# Patient Record
Sex: Female | Born: 1985 | Race: Black or African American | Hispanic: No | Marital: Married | Smoking: Never smoker
Health system: Southern US, Community
[De-identification: ages and names within clinical notes are randomized; demographics above are authoritative.]

---

## 2010-02-19 ENCOUNTER — Emergency Department (HOSPITAL_COMMUNITY): Admission: EM | Admit: 2010-02-19 | Discharge: 2010-02-19 | Payer: Self-pay | Admitting: Family Medicine

## 2014-08-18 ENCOUNTER — Encounter (HOSPITAL_COMMUNITY): Payer: Self-pay | Admitting: Emergency Medicine

## 2014-08-18 ENCOUNTER — Emergency Department (HOSPITAL_COMMUNITY)
Admission: EM | Admit: 2014-08-18 | Discharge: 2014-08-18 | Disposition: A | Discharge status: Home / Self Care | Payer: Self-pay | Attending: Emergency Medicine | Admitting: Emergency Medicine

## 2014-08-18 DIAGNOSIS — B9689 Other specified bacterial agents as the cause of diseases classified elsewhere: Secondary | ICD-10-CM | POA: Insufficient documentation

## 2014-08-18 DIAGNOSIS — N342 Other urethritis: Secondary | ICD-10-CM | POA: Insufficient documentation

## 2014-08-18 LAB — POC URINE PREG, ED: Preg Test, Ur: NEGATIVE

## 2014-08-18 LAB — URINALYSIS, ROUTINE W REFLEX MICROSCOPIC
BILIRUBIN URINE: NEGATIVE
Glucose, UA: NEGATIVE mg/dL
HGB URINE DIPSTICK: NEGATIVE
KETONES UR: NEGATIVE mg/dL
NITRITE: NEGATIVE
Protein, ur: NEGATIVE mg/dL
SPECIFIC GRAVITY, URINE: 1.02 (ref 1.005–1.030)
Urobilinogen, UA: 0.2 mg/dL (ref 0.0–1.0)
pH: 6.5 (ref 5.0–8.0)

## 2014-08-18 LAB — URINE MICROSCOPIC-ADD ON

## 2014-08-18 NOTE — ED Notes (Addendum)
Pt phone [phone]: verified correct in pt chart

## 2014-08-18 NOTE — Discharge Instructions (Signed)
Your urine analysis showed no signs if infection. We will culture your urine, and if it returns positive you will receive a call. Follow up with your doctor or your ob/gyn. Return if worsening.    Dysuria Dysuria is the medical term for pain with urination. There are many causes for dysuria, but urinary tract infection is the most common. If a urinalysis was performed it can show that there is a urinary tract infection. A urine culture confirms that you or your child is sick. You will need to follow up with a healthcare provider because:  If a urine culture was done you will need to know the culture results and treatment recommendations.  If the urine culture was positive, you or your child will need to be put on antibiotics or know if the antibiotics prescribed are the right antibiotics for your urinary tract infection.  If the urine culture is negative (no urinary tract infection), then other causes may need to be explored or antibiotics need to be stopped. Today laboratory work may have been done and there does not seem to be an infection. If cultures were done they will take at least 24 to 48 hours to be completed. Today x-rays may have been taken and they read as normal. No cause can be found for the problems. The x-rays may be re-read by a radiologist and you will be contacted if additional findings are made. You or your child may have been put on medications to help with this problem until you can see your primary caregiver. If the problems get better, see your primary caregiver if the problems return. If you were given antibiotics (medications which kill germs), take all of the mediations as directed for the full course of treatment.  If laboratory work was done, you need to find the results. Leave a telephone number where you can be reached. If this is not possible, make sure you find out how you are to get test results. HOME CARE INSTRUCTIONS   Drink lots of fluids. For adults, drink  eight, 8 ounce glasses of clear juice or water a day. For children, replace fluids as suggested by your caregiver.  Empty the bladder often. Avoid holding urine for long periods of time.  After a bowel movement, women should cleanse front to back, using each tissue only once.  Empty your bladder before and after sexual intercourse.  Take all the medicine given to you until it is gone. You may feel better in a few days, but TAKE ALL MEDICINE.  Avoid caffeine, tea, alcohol and carbonated beverages, because they tend to irritate the bladder.  In men, alcohol may irritate the prostate.  Only take over-the-counter or prescription medicines for pain, discomfort, or fever as directed by your caregiver.  If your caregiver has given you a follow-up appointment, it is very important to keep that appointment. Not keeping the appointment could result in a chronic or permanent injury, pain, and disability. If there is any problem keeping the appointment, you must call back to this facility for assistance. SEEK IMMEDIATE MEDICAL CARE IF:   Back pain develops.  A fever develops.  There is nausea (feeling sick to your stomach) or vomiting (throwing up).  Problems are no better with medications or are getting worse. MAKE SURE YOU:   Understand these instructions.  Will watch your condition.  Will get help right away if you are not doing well or get worse. Document Released: 07/19/2004 Document Revised: 01/13/2012 Document Reviewed: 05/26/2008 ExitCare Patient  Information 2015 [place], [place]. This information is not intended to replace advice given to you by your health care provider. Make sure you discuss any questions you have with your health care provider.

## 2014-08-18 NOTE — ED Provider Notes (Signed)
Pt has dysuria X 4 days - "tinging", in the vagina with urination - no d/c, no bleeding, no systemic sx.  Soft NT abd on my exam.  Labs pending.  UA neg, pt refuses pelvic exam, soft and NT abd  Filed Vitals:   08/18/14 0901 08/18/14 0919 08/18/14 1000 08/18/14 1026  BP: 149/74  130/63   Pulse: 102  84   Temp: 98.7 F (37.1 C)   98.7 F (37.1 C)  TempSrc: Oral   Oral  Resp:  14    Height: 5\' 7"  (1.702 m)     Weight: 210 lb (95.255 kg)     SpO2: 100%  100%      Medical screening examination/treatment/procedure(s) were conducted as a shared visit with non-physician practitioner(s) and myself.  I personally evaluated the patient during the encounter.  Clinical Impression:   Final diagnoses:  Urethritis         Vida RollerBrian D Salbador Fiveash, MD 08/18/14 2032

## 2014-08-18 NOTE — ED Notes (Signed)
Pt states she has had tingling and pressure when urinating.  Pt denies pain while not urinating.

## 2014-08-18 NOTE — ED Provider Notes (Signed)
CSN: [id number]     Arrival date & time 08/18/14  0848 History   First MD Initiated Contact with Patient 08/18/14 [phone]     Chief Complaint  Patient presents with  . Urinary Tract Infection     (Consider location/radiation/quality/duration/timing/severity/associated sxs/prior Treatment) HPI Olivia Murray is a 28 y.o. female who presents to ED with complaint of "tingling when urinating and pressure." Pt states symptoms began about 3 days ago. Denies abd pain, back pain, urinary frequency or urgency. No vaginal discharge or bleeding. No concern for STI. No rash. States tried to drink cranberry juice with no relief. Hx of UTI 11 years ago when pregnant. No fever, chill, nausea, vomiting.   History reviewed. No pertinent past medical history. History reviewed. No pertinent past surgical history. No family history on file. History  Substance Use Topics  . Smoking status: Not on file  . Smokeless tobacco: Not on file  . Alcohol Use: Not on file   OB History   Grav Para Term Preterm Abortions TAB SAB Ect Mult Living                 Review of Systems  Constitutional: Negative for fever and chills.  Respiratory: Negative for cough, chest tightness and shortness of breath.   Cardiovascular: Negative for chest pain, palpitations and leg swelling.  Gastrointestinal: Negative for nausea, vomiting and abdominal pain.  Genitourinary: Positive for dysuria. Negative for urgency, frequency, flank pain, vaginal bleeding, vaginal discharge, difficulty urinating, vaginal pain and pelvic pain.  Musculoskeletal: Negative for arthralgias, myalgias, neck pain and neck stiffness.  Skin: Negative for rash.  Neurological: Negative for dizziness, weakness and headaches.  All other systems reviewed and are negative.     Allergies  Review of patient's allergies indicates not on file.  Home Medications   Prior to Admission medications   Not on File   BP 149/74  Pulse 102  Temp(Src) 98.7 F  (37.1 C) (Oral)  Ht 5\' 7"  (1.702 m)  Wt 210 lb (95.255 kg)  BMI 32.88 kg/m2  SpO2 100% Physical Exam  Nursing note and vitals reviewed. Constitutional: She appears well-developed and well-nourished. No distress.  HENT:  Head: Normocephalic.  Eyes: Conjunctivae are normal.  Neck: Neck supple.  Cardiovascular: Normal rate, regular rhythm and normal heart sounds.   Pulmonary/Chest: Effort normal and breath sounds normal. No respiratory distress. She has no wheezes. She has no rales.  Abdominal: Soft. Bowel sounds are normal. She exhibits no distension. There is no tenderness. There is no rebound.  No CVA tenderness bilaterally  Musculoskeletal: She exhibits no edema.  Neurological: She is alert.  Skin: Skin is warm and dry.  Psychiatric: She has a normal mood and affect. Her behavior is normal.    ED Course  Procedures (including critical care time) Labs Review Labs Reviewed  URINALYSIS, ROUTINE W REFLEX MICROSCOPIC - Abnormal; Notable for the following:    Leukocytes, UA TRACE (*)    All other components within normal limits  URINE CULTURE  URINE MICROSCOPIC-ADD ON  POC URINE PREG, ED    Imaging Review No results found. her he   EKG Interpretation None      MDM   Final diagnoses:  Urethritis    Pt with dysuria, pressure when urinating. Denies vaginal complaints, does not want pelvic exam. UA obtained, no signs of infection. Culture sent. Urine preg negative. Pt reassured. Possible non infectious urethritis. instructed to follow up with pcp. Pt agrees to the plan.   Filed Vitals:  08/18/14 0901 08/18/14 0919  BP: 149/74   Pulse: 102   Temp: 98.7 F (37.1 C)   TempSrc: Oral   Resp:  14  Height: 5\' 7"  (1.702 m)   Weight: 210 lb (95.255 kg)   SpO2: 100%       Quinlee Sciarra A Nichlos Kunzler, PA-C 08/18/14 1505

## 2014-08-18 NOTE — ED Provider Notes (Signed)
Medical screening examination/treatment/procedure(s) were performed by non-physician practitioner and as supervising physician I was immediately available for consultation/collaboration.    Vida RollerBrian D Lynnwood Beckford, MD 08/18/14 2033

## 2014-08-20 LAB — URINE CULTURE

## 2014-08-22 ENCOUNTER — Telehealth (HOSPITAL_BASED_OUTPATIENT_CLINIC_OR_DEPARTMENT_OTHER): Payer: Self-pay

## 2014-08-22 NOTE — Telephone Encounter (Signed)
Post ED Visit - Positive Culture Follow-up  Culture report reviewed by antimicrobial stewardship pharmacist: []  Wes Dulaney, Pharm.D., BCPS [x]  Celedonio MiyamotoJeremy Frens, Pharm.D., BCPS/Emily PatrickWest PA []  Georgina PillionElizabeth Martin, [place]arm.D., BCPS []  KenwoodMinh Pham, [address]Pharm[address]D., BCPS, AAHIVP []  Estella HuskMichelle Turner, Pharm.D., BCPS, AAHIVP []  Carly Sabat, Pharm.D. []  Enzo BiNathan Batchelder, [address]Pharm.D.  Positive Urine culture No further patient follow-up is required at this time.  Arvid RightClark, Raymar Joiner Dorn 08/22/2014, 4:32 AM

## 2014-09-05 ENCOUNTER — Encounter (HOSPITAL_COMMUNITY): Payer: Self-pay | Admitting: Emergency Medicine

## 2015-08-21 ENCOUNTER — Emergency Department (HOSPITAL_COMMUNITY)
Admission: EM | Admit: 2015-08-21 | Discharge: 2015-08-21 | Disposition: A | Discharge status: Home / Self Care | Payer: Self-pay | Attending: Emergency Medicine | Admitting: Emergency Medicine

## 2015-08-21 ENCOUNTER — Encounter (HOSPITAL_COMMUNITY): Payer: Self-pay | Admitting: *Deleted

## 2015-08-21 DIAGNOSIS — Y9389 Activity, other specified: Secondary | ICD-10-CM | POA: Insufficient documentation

## 2015-08-21 DIAGNOSIS — Y9289 Other specified places as the place of occurrence of the external cause: Secondary | ICD-10-CM | POA: Insufficient documentation

## 2015-08-21 DIAGNOSIS — X58XXXA Exposure to other specified factors, initial encounter: Secondary | ICD-10-CM | POA: Insufficient documentation

## 2015-08-21 DIAGNOSIS — Y998 Other external cause status: Secondary | ICD-10-CM | POA: Insufficient documentation

## 2015-08-21 DIAGNOSIS — S0501XA Injury of conjunctiva and corneal abrasion without foreign body, right eye, initial encounter: Secondary | ICD-10-CM

## 2015-08-21 MED ORDER — FLUORESCEIN SODIUM 1 MG OP STRP
1.0000 | ORAL_STRIP | Freq: Once | OPHTHALMIC | Status: AC
  Administered 2015-08-21: 1 via OPHTHALMIC
  Filled 2015-08-21: qty 1

## 2015-08-21 MED ORDER — ERYTHROMYCIN 5 MG/GM OP OINT: TOPICAL_OINTMENT | OPHTHALMIC | Status: AC

## 2015-08-21 MED ORDER — TETRACAINE HCL 0.5 % OP SOLN
2.0000 [drp] | Freq: Once | OPHTHALMIC | Status: AC
  Administered 2015-08-21: 2 [drp] via OPHTHALMIC
  Filled 2015-08-21: qty 2

## 2015-08-21 NOTE — ED Provider Notes (Signed)
CSN: [id number]     Arrival date & time 08/21/15  1635 History   By signing my name below, I, Olivia Murray, attest that this documentation has been prepared under the direction and in the presence of Ayinde Swim PA-C.  Electronically Signed: Arlan OrganAshley Murray, ED Scribe. 08/21/2015. 6:45 PM.   Chief Complaint  Patient presents with  . Eye Problem   The history is provided by the patient. No language interpreter was used.    HPI Comments: Olivia Murray is a 29 y.o. female without any pertinent past medical history who presents to the Emergency Department here for an eye problem this evening. Pt states something was flew in her R eye at approximately 3:15 PM yesterday and she attempted to remove the object with large amount of irrigation. States she then noted redness, irritation, clear drainage, and mild itching to the eye that has not improved. OTC clear eyes and sons prescribed eye ointment attempted to eye without any relief. Denies any recent fever or chills. No current visual changes. Mrs. Olivia Murray is is followed by ophthalmologist. No known allergies to medications.  PCP: No PCP Per Patient   History reviewed. No pertinent past medical history. History reviewed. No pertinent past surgical history. No family history on file. Social History  Substance Use Topics  . Smoking status: Never Smoker   . Smokeless tobacco: None  . Alcohol Use: 1.2 oz/week    2 Glasses of wine per week   OB History    Gravida Para Term Preterm AB TAB SAB Ectopic Multiple Living   4         4     Review of Systems  Constitutional: Negative for fever and chills.  Eyes: Positive for pain, discharge, redness and itching. Negative for photophobia and visual disturbance.  Psychiatric/Behavioral: Negative for confusion.  All other systems reviewed and are negative.     Allergies  Review of patient's allergies indicates no known allergies.  Home Medications   Prior to Admission medications    Medication Sig Start Date End Date Taking? Authorizing Provider  erythromycin ophthalmic ointment Place a 1/2 inch ribbon of ointment into the lower eyelid twice a day for 10 days 08/21/15   Marlon Peliffany Jim Philemon, PA-C   Triage Vitals: BP 125/71 mmHg  Pulse 75  Temp(Src) 99 F (37.2 C) (Temporal)  Resp 18  Wt 215 lb (97.523 kg)  SpO2 100%  LMP 08/17/2015   Physical Exam  Constitutional: She is oriented to person, place, and time. She appears well-developed and well-nourished.  HENT:  Head: Normocephalic.  Eyes: EOM and lids are normal. Pupils are equal, round, and reactive to light. Right eye exhibits no discharge and no exudate. Left eye exhibits no discharge and no exudate. Right conjunctiva is injected. Right conjunctiva has no hemorrhage. Left conjunctiva is not injected. Left conjunctiva has no hemorrhage. Right eye exhibits normal extraocular motion and no nystagmus. Left eye exhibits normal extraocular motion and no nystagmus.    Visual Acuity - Bilateral Distance: 20/15 ; R Distance: 20/20 ; L Distance: 20/20  Neck: Normal range of motion.  Pulmonary/Chest: Effort normal.  Abdominal: She exhibits no distension.  Musculoskeletal: Normal range of motion.  Neurological: She is alert and oriented to person, place, and time.  Psychiatric: She has a normal mood and affect.  Nursing note and vitals reviewed.   ED Course  Procedures (including critical care time)  DIAGNOSTIC STUDIES: Oxygen Saturation is 100% on RA, Normal by my interpretation.    COORDINATION OF  CARE: 6:42 PM-Will discharge with a prescription for an antibiotic ointment to apply BID. 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 Discussed treatment plan with pt at bedside and pt agreed to plan.     Labs Review Labs Reviewed - No data to display  Imaging Review No results found. I have personally reviewed and evaluated these images and lab results  as part of my medical decision-making.   EKG Interpretation None      MDM   Final diagnoses:  Corneal abrasion, right, initial encounter    Rx: erythromycin opht  Medications  tetracaine (PONTOCAINE) 0.5 % ophthalmic solution 2 drop (2 drops Right Eye Given 08/21/15 1756)  fluorescein ophthalmic strip 1 strip (1 strip Right Eye Given 08/21/15 1756)    29 y.o.Olivia Murray's medical screening exam was performed and I feel the patient has had an appropriate workup for their chief complaint at this time and likelihood of emergent condition existing is low. They have been counseled on decision, discharge, follow up and which symptoms necessitate immediate return to the emergency department. They or their family verbally stated understanding and agreement with plan and discharged in stable condition.   Vital signs are stable at discharge. Filed Vitals:   08/21/15 1908  BP: 125/71  Pulse: 75  Temp:   Resp: 18    I personally performed the services described in this documentation, which was scribed in my presence. The recorded information has been reviewed and is accurate.   Marlon Pel, PA-C 08/21/15 1910  Jerelyn Scott, MD 08/21/15 [phone]

## 2015-08-21 NOTE — ED Notes (Signed)
Pt reports R eye irritation and redness.  States she scratched it yesterday, now feels like something is in it.  Redness noted.  Pt reports clear drainage.  Denies any visual changes at this time.

## 2015-08-21 NOTE — Discharge Instructions (Signed)

## 2016-12-21 ENCOUNTER — Emergency Department (HOSPITAL_COMMUNITY)
Admission: EM | Admit: 2016-12-21 | Discharge: 2016-12-21 | Disposition: A | Discharge status: Home / Self Care | Payer: Self-pay | Attending: Emergency Medicine | Admitting: Emergency Medicine

## 2016-12-21 ENCOUNTER — Encounter (HOSPITAL_COMMUNITY): Payer: Self-pay | Admitting: Emergency Medicine

## 2016-12-21 DIAGNOSIS — Z79899 Other long term (current) drug therapy: Secondary | ICD-10-CM | POA: Insufficient documentation

## 2016-12-21 DIAGNOSIS — J111 Influenza due to unidentified influenza virus with other respiratory manifestations: Secondary | ICD-10-CM | POA: Insufficient documentation

## 2016-12-21 DIAGNOSIS — R69 Illness, unspecified: Secondary | ICD-10-CM

## 2016-12-21 MED ORDER — IBUPROFEN 800 MG PO TABS: 800.0000 mg | ORAL_TABLET | Freq: Three times a day (TID) | ORAL | 0 refills | Status: AC | PRN

## 2016-12-21 NOTE — ED Provider Notes (Signed)
[facility]-EMERGENCY DEPT Provider Note   CSN: [id number] Arrival date & time: 12/21/16  1231   By signing my name below, I, Soijett Blue, attest that this documentation has been prepared under the direction and in the presence of Trixie DredgeEmily Dlynn Ranes, PA-C Electronically Signed: Soijett Blue, ED Scribe. 12/21/16. 1:26 PM.  History   Chief Complaint Chief Complaint  Patient presents with  . Flu Like Symptoms    HPI Olivia Murray is a 31 y.o. female who presents to the Emergency Department complaining of flu-like symptoms onset 2 days ago. Pt reports associated chills, fever of 99.9 last night, sore throat, cough, rhinorrhea, and body aches. Pt has tried motrin with her last dose being yesterday with no relief of her symptoms. Pt notes that she didn't obtain her flu vaccination this past year. Pt notes that the her 33 year old son tested positive for the flu yesterday. Pt denies abdominal pain, nausea, vomiting, SOB, rash, urinary symptoms, ear pain, and any other symptoms. Denies hx of asthma.   The history is provided by the patient. No language interpreter was used.    History reviewed. No pertinent past medical history.  There are no active problems to display for this patient.   History reviewed. No pertinent surgical history.  OB History    Gravida Para Term Preterm AB Living   4         4   SAB TAB Ectopic Multiple Live Births                   Home Medications    Prior to Admission medications   Medication Sig Start Date End Date Taking? Authorizing Provider  erythromycin ophthalmic ointment Place a 1/2 inch ribbon of ointment into the lower eyelid twice a day for 10 days 08/21/15   Marlon Peliffany Greene, PA-C  ibuprofen (ADVIL,MOTRIN) 800 MG tablet Take 1 tablet (800 mg total) by mouth every 8 (eight) hours as needed for mild pain or moderate pain. 12/21/16   Trixie DredgeEmily Ardel Jagger, PA-C    Family History No family history on file.  Social History Social History  Substance Use Topics  .  Smoking status: Never Smoker  . Smokeless tobacco: Not on file  . Alcohol use 1.2 oz/week    2 Glasses of wine per week     Allergies   Patient has no known allergies.   Review of Systems Review of Systems  Constitutional: Positive for chills and fever.  HENT: Positive for rhinorrhea and sore throat. Negative for ear pain.   Respiratory: Positive for cough. Negative for shortness of breath.   Gastrointestinal: Negative for abdominal pain, nausea and vomiting.  Genitourinary: Negative for difficulty urinating.  Musculoskeletal: Positive for myalgias.     Physical Exam Updated Vital Signs BP 156/96 (BP Location: Right Arm)   Pulse 96   Temp 99.4 F (37.4 C) (Oral)   Resp 16   Wt 101.6 kg   LMP 11/30/2016   SpO2 97%   BMI 35.07 kg/m   Physical Exam  Constitutional: She appears well-developed and well-nourished. No distress.  HENT:  Head: Normocephalic and atraumatic.  Nose: Mucosal edema present.  Mouth/Throat: Oropharynx is clear and moist. No oropharyngeal exudate.  Edema and erythema of the nasal mucosa.   Eyes: Conjunctivae are normal.  Neck: Neck supple.  Cardiovascular: Normal rate, regular rhythm and normal heart sounds.  Exam reveals no gallop and no friction rub.   No murmur heard. Pulmonary/Chest: Effort normal and breath sounds normal. No respiratory distress.  She has no wheezes. She has no rales.  Neurological: She is alert.  Skin: She is not diaphoretic.  Nursing note and vitals reviewed.    ED Treatments / Results  DIAGNOSTIC STUDIES: Oxygen Saturation is 97% on RA, nl by my interpretation.    COORDINATION OF CARE: 1:18 PM Discussed treatment plan with pt at bedside which includes ibuprofen Rx and pt agreed to plan.  Procedures Procedures (including critical care time)  Medications Ordered in ED Medications - No data to display   Initial Impression / Assessment and Plan / ED Course  I have reviewed the triage vital signs and the nursing  notes.   Patient with symptoms consistent with flu-like illness. Pt with slightly elevated temperature of 99.4 Vitals are stable. No signs of dehydration, tolerating PO's. Lungs are clear. Due to patient's presentation and physical exam a chest x-ray was not ordered bc likely diagnosis of flu.  Discussed the cost versus benefit of Tamiflu treatment with the patient. Patient will be discharged with instructions to orally hydrate, rest, and use over-the-counter medications such as anti-inflammatories ibuprofen and Aleve for muscle aches and Tylenol for fever.  Patient will also be given a cough suppressant. Read the information below.  You may return to the Emergency Department at any time for worsening condition or any new symptoms that concern you.   Final Clinical Impressions(s) / ED Diagnoses   Final diagnoses:  Influenza-like illness    New Prescriptions Discharge Medication List as of 12/21/2016  1:23 PM    START taking these medications   Details  ibuprofen (ADVIL,MOTRIN) 800 MG tablet Take 1 tablet (800 mg total) by mouth every 8 (eight) hours as needed for mild pain or moderate pain., Starting Sat 12/21/2016, Print       I personally performed the services described in this documentation, which was scribed in my presence. The recorded information has been reviewed and is accurate.    Trixie Dredge, PA-C 12/21/16 1441    Gwyneth Sprout, MD 12/21/16 [phone]

## 2016-12-21 NOTE — ED Triage Notes (Addendum)
Pt complaint of flu like symptoms with associated chills/fever, sore throat, and cough onset Thursday. Last motrin taken 2200 yesterday. Denies n/v/d.

## 2016-12-21 NOTE — Discharge Instructions (Signed)
Read the information below.  Use the prescribed medication as directed.  Please discuss all new medications with your pharmacist.  You may return to the Emergency Department at any time for worsening condition or any new symptoms that concern you.  If you develop high fevers that do not resolve with tylenol or ibuprofen, you have difficulty swallowing or breathing, or you are unable to tolerate fluids by mouth, return to the ER for a recheck.    °

## 2020-05-15 ENCOUNTER — Ambulatory Visit: Payer: Self-pay | Attending: Internal Medicine

## 2020-05-15 DIAGNOSIS — Z23 Encounter for immunization: Secondary | ICD-10-CM

## 2020-05-15 NOTE — Progress Notes (Signed)
° °  Covid-19 Vaccination Clinic  Name:  Corayma Cashatt    MRN: [id number] DOB: [birth year]  05/15/2020  Ms. Gillentine was observed post Covid-19 immunization for 15 minutes without incident. She was provided with Vaccine Information Sheet and instruction to access the V-Safe system.   Ms. Branan was instructed to call 911 with any severe reactions post vaccine:  Difficulty breathing   Swelling of face and throat   A fast heartbeat   A bad rash all over body   Dizziness and weakness   Immunizations Administered    Name Date Dose VIS Date Route   Pfizer COVID-19 Vaccine 05/15/2020 10:53 AM 0.3 mL 12/29/2018 Intramuscular   Manufacturer: [employer], [employer]   Lot: [id number]   NDC: [address]-6

## 2020-06-06 ENCOUNTER — Ambulatory Visit: Payer: Self-pay | Attending: Internal Medicine

## 2020-06-06 DIAGNOSIS — Z23 Encounter for immunization: Secondary | ICD-10-CM

## 2020-06-06 NOTE — Progress Notes (Signed)
   Covid-19 Vaccination Clinic  Name:  Olivia Murray    MRN: [id number] DOB: 10-23-1986  06/06/2020  [name] was observed post Covid-19 immunization for 15 minutes without incident. She was provided with Vaccine Information Sheet and instruction to access the V-Safe system.   [name] was instructed to call 911 with any severe reactions post vaccine: Marland Kitchen Difficulty breathing  . Swelling of face and throat  . A fast heartbeat  . A bad rash all over body  . Dizziness and weakness   Immunizations Administered    Name Date Dose VIS Date Route   Pfizer COVID-19 Vaccine 06/06/2020  9:12 AM 0.3 mL 12/29/2018 Intramuscular   Manufacturer: [employer], [employer]   Lot: [id number]   NDC: [address]-9

## 2020-08-22 ENCOUNTER — Ambulatory Visit
Admission: RE | Admit: 2020-08-22 | Discharge: 2020-08-22 | Disposition: A | Discharge status: Home / Self Care | Payer: 59 | Source: Ambulatory Visit | Attending: Family Medicine | Admitting: Family Medicine

## 2020-08-22 ENCOUNTER — Other Ambulatory Visit: Payer: Self-pay | Admitting: Family Medicine

## 2020-08-22 DIAGNOSIS — M25532 Pain in left wrist: Secondary | ICD-10-CM

## 2021-01-30 ENCOUNTER — Encounter (INDEPENDENT_AMBULATORY_CARE_PROVIDER_SITE_OTHER): Payer: Self-pay

## 2022-03-20 IMAGING — CR DG WRIST COMPLETE 3+V*L*
4 series · 4 of 4 positions shown · non-contrast
Comparison: None.

CLINICAL DATA: Chronic wrist pain

EXAM:
LEFT WRIST - COMPLETE 3+ VIEW

[x wrist pa left]
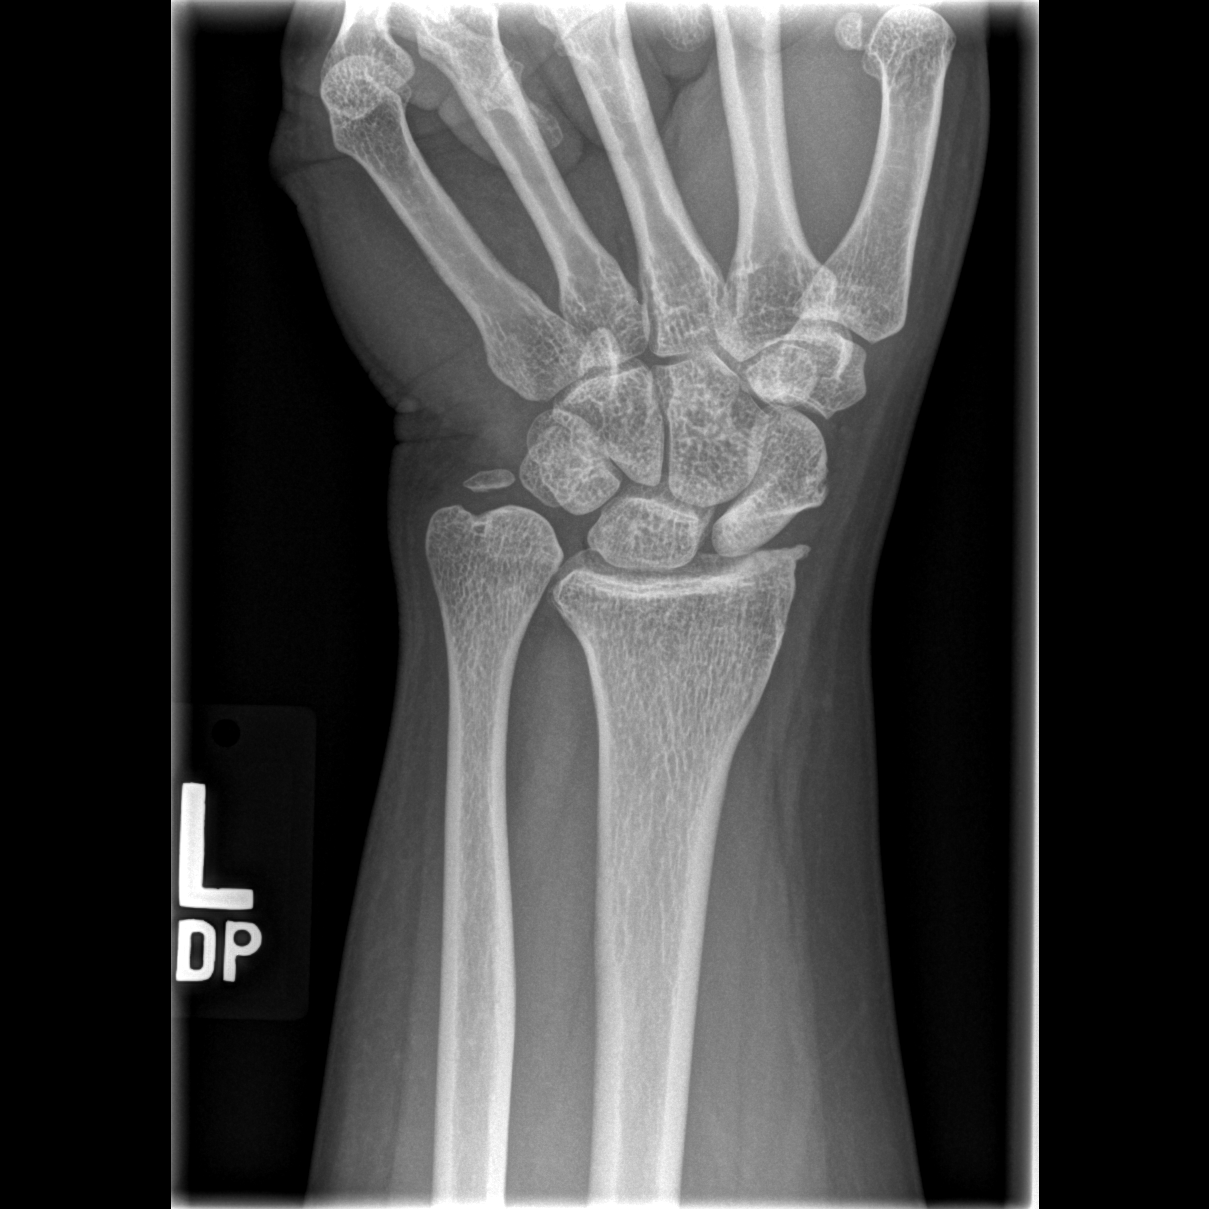

[x wrist obl left]
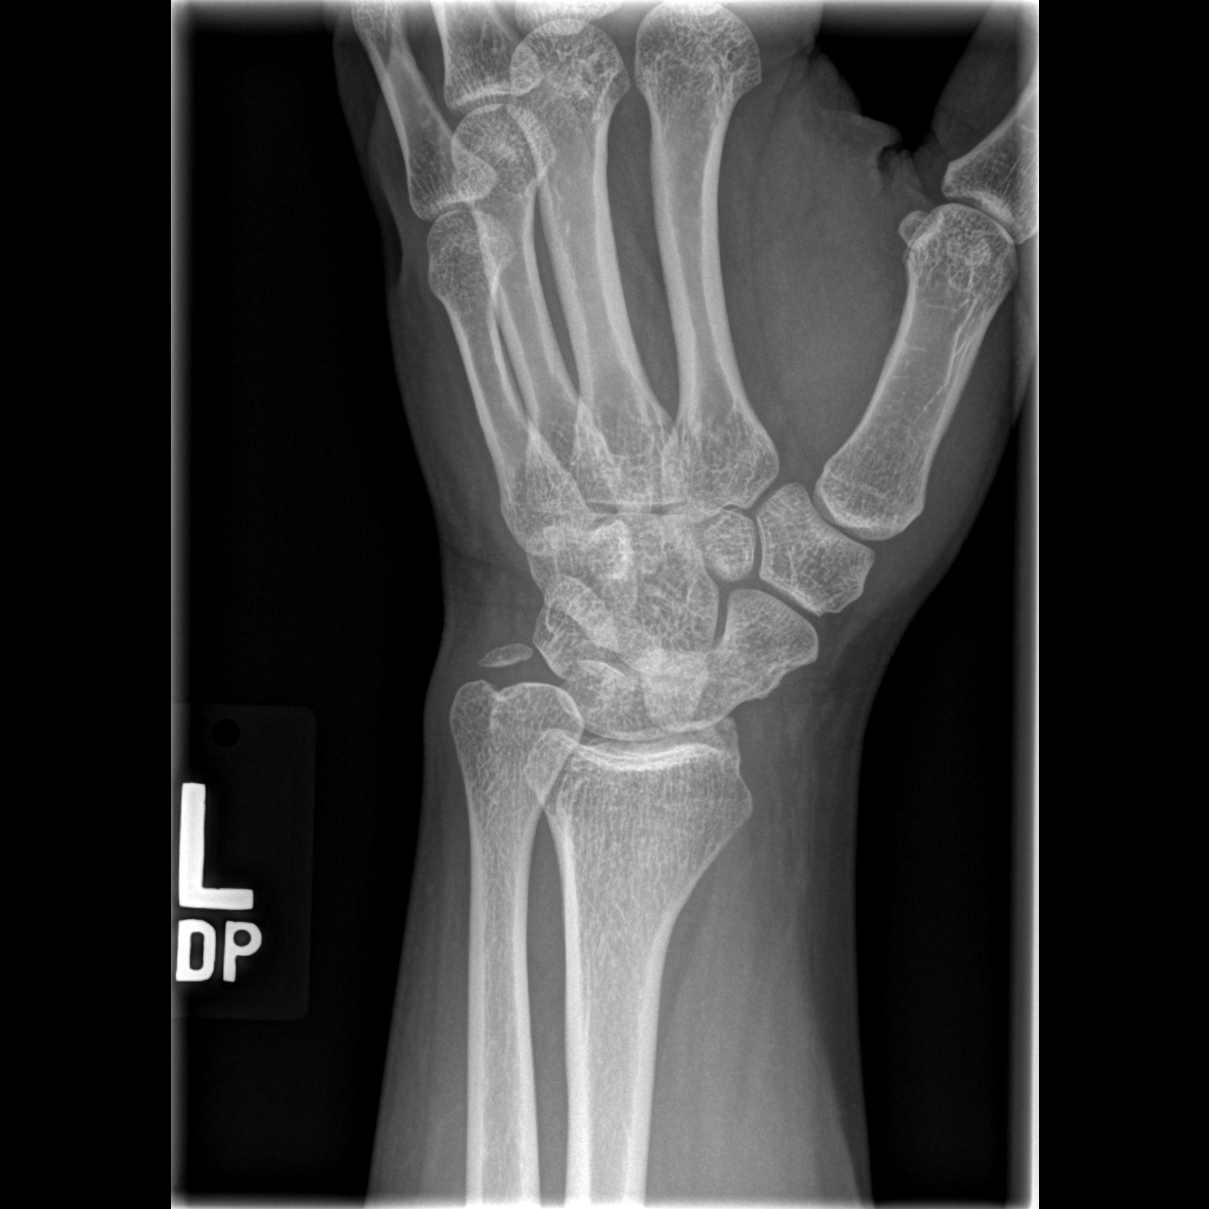

[x wrist lat left]
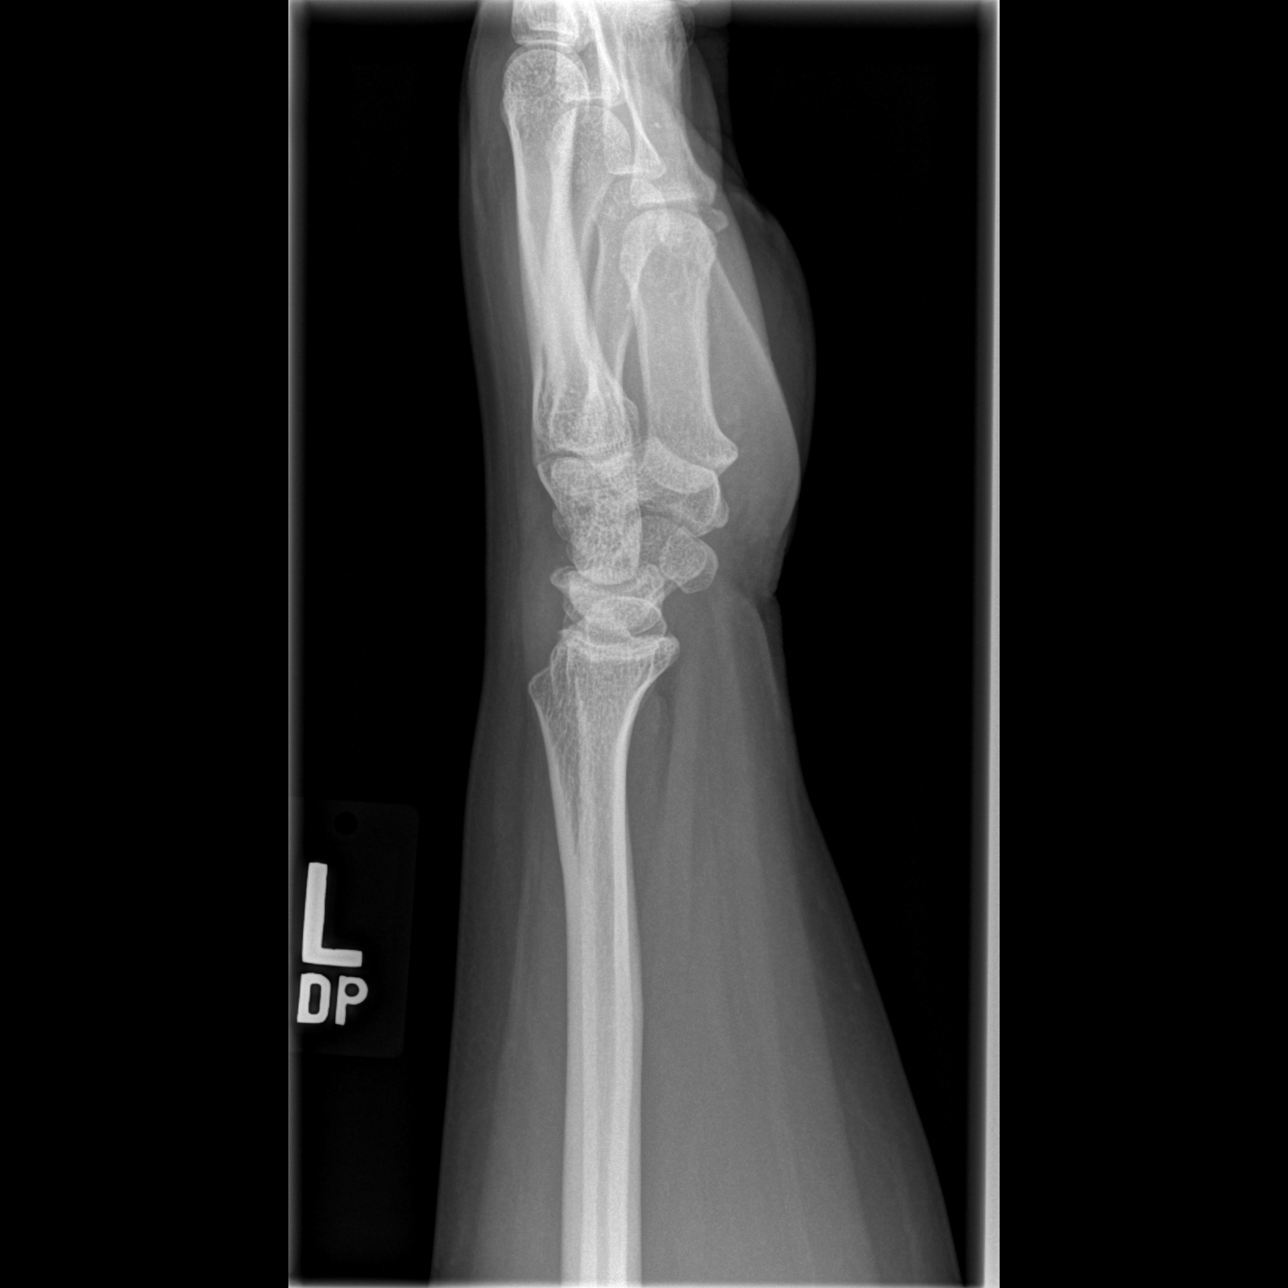

[x navicular]
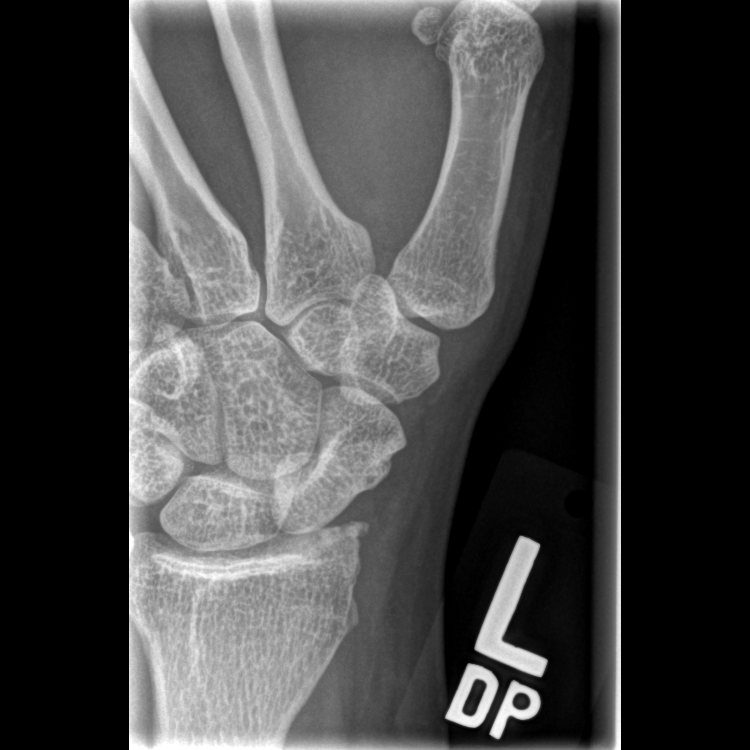

[4 of 4 positions shown; findings below may reference images not displayed]

FINDINGS: Ossicle or old injury at the distal ulna. No acute displaced
fracture or malalignment. Mild chronic appearing deformity at radial
styloid with small spurring. Joint space is relatively maintained.
Ulnar positive variance.
IMPRESSION: 1. No acute osseous abnormality.
2. Ossicle or old injury at the distal ulna. Mild spurring the
radial styloid.
3. Positive ulnar variance

## 2022-06-12 ENCOUNTER — Encounter (INDEPENDENT_AMBULATORY_CARE_PROVIDER_SITE_OTHER): Payer: Self-pay
